# Patient Record
Sex: Male | Born: 1995 | Hispanic: No | Marital: Single | Smoking: Never smoker
Health system: Southern US, Community
[De-identification: ages and names within clinical notes are randomized; demographics above are authoritative.]

## PROBLEM LIST (undated history)

## (undated) DIAGNOSIS — Z789 Other specified health status: Secondary | ICD-10-CM

## (undated) HISTORY — PX: NO PAST SURGERIES: SHX2092

## (undated) SURGERY — APPENDECTOMY, LAPAROSCOPIC
Anesthesia: General

---
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
## 2016-01-01 ENCOUNTER — Observation Stay (HOSPITAL_COMMUNITY): Payer: Medicaid Other | Admitting: Certified Registered"

## 2016-01-01 ENCOUNTER — Encounter (HOSPITAL_COMMUNITY): Admission: EM | Disposition: A | Discharge status: Home / Self Care | Payer: Self-pay | Source: Home / Self Care

## 2016-01-01 ENCOUNTER — Encounter (HOSPITAL_COMMUNITY): Payer: Self-pay

## 2016-01-01 DIAGNOSIS — K3580 Unspecified acute appendicitis: Secondary | ICD-10-CM | POA: Diagnosis present

## 2016-01-01 HISTORY — PX: LAPAROSCOPIC APPENDECTOMY: SHX408

## 2016-01-01 SURGERY — APPENDECTOMY, LAPAROSCOPIC
Anesthesia: General | Site: Abdomen

## 2016-01-01 MED ORDER — MIDAZOLAM HCL 5 MG/5ML IJ SOLN
INTRAMUSCULAR | Status: DC | PRN
  Administered 2016-01-01: 2 mg via INTRAVENOUS

## 2016-01-01 MED ORDER — PROPOFOL 10 MG/ML IV BOLUS
INTRAVENOUS | Status: DC | PRN
  Administered 2016-01-01: 110 mg via INTRAVENOUS

## 2016-01-01 MED ORDER — SUGAMMADEX SODIUM 200 MG/2ML IV SOLN
INTRAVENOUS | Status: AC
  Filled 2016-01-01: qty 2

## 2016-01-01 MED ORDER — ONDANSETRON HCL 4 MG/2ML IJ SOLN: 4.0000 mg | Freq: Four times a day (QID) | INTRAMUSCULAR | Status: DC | PRN

## 2016-01-01 MED ORDER — PHENYLEPHRINE HCL 10 MG/ML IJ SOLN
INTRAMUSCULAR | Status: DC | PRN
  Administered 2016-01-01: 40 ug via INTRAVENOUS

## 2016-01-01 MED ORDER — METRONIDAZOLE IN NACL 5-0.79 MG/ML-% IV SOLN
500.0000 mg | Freq: Once | INTRAVENOUS | Status: AC
  Administered 2016-01-01 (×2): 500 mg via INTRAVENOUS
  Filled 2016-01-01: qty 100

## 2016-01-01 MED ORDER — KCL IN DEXTROSE-NACL 20-5-0.45 MEQ/L-%-% IV SOLN: INTRAVENOUS | Status: DC

## 2016-01-01 MED ORDER — PROPOFOL 10 MG/ML IV BOLUS
INTRAVENOUS | Status: AC
  Filled 2016-01-01: qty 20

## 2016-01-01 MED ORDER — MEPERIDINE HCL 25 MG/ML IJ SOLN: 6.2500 mg | INTRAMUSCULAR | Status: DC | PRN

## 2016-01-01 MED ORDER — ONDANSETRON HCL 4 MG/2ML IJ SOLN
INTRAMUSCULAR | Status: DC | PRN
  Administered 2016-01-01: 4 mg via INTRAVENOUS

## 2016-01-01 MED ORDER — SUCCINYLCHOLINE CHLORIDE 20 MG/ML IJ SOLN
INTRAMUSCULAR | Status: DC | PRN
  Administered 2016-01-01: 120 mg via INTRAVENOUS

## 2016-01-01 MED ORDER — MIDAZOLAM HCL 2 MG/2ML IJ SOLN
INTRAMUSCULAR | Status: AC
  Filled 2016-01-01: qty 2

## 2016-01-01 MED ORDER — ONDANSETRON HCL 4 MG/2ML IJ SOLN: 4.0000 mg | Freq: Three times a day (TID) | INTRAMUSCULAR | Status: DC | PRN

## 2016-01-01 MED ORDER — ONDANSETRON HCL 4 MG/2ML IJ SOLN: 4.0000 mg | Freq: Once | INTRAMUSCULAR | Status: DC | PRN

## 2016-01-01 MED ORDER — LIDOCAINE HCL (CARDIAC) 20 MG/ML IV SOLN
INTRAVENOUS | Status: DC | PRN
  Administered 2016-01-01: 100 mg via INTRAVENOUS

## 2016-01-01 MED ORDER — FENTANYL CITRATE (PF) 100 MCG/2ML IJ SOLN
INTRAMUSCULAR | Status: AC
  Filled 2016-01-01: qty 4

## 2016-01-01 MED ORDER — ROCURONIUM BROMIDE 100 MG/10ML IV SOLN
INTRAVENOUS | Status: DC | PRN
  Administered 2016-01-01: 50 mg via INTRAVENOUS

## 2016-01-01 MED ORDER — ONDANSETRON HCL 4 MG/2ML IJ SOLN
INTRAMUSCULAR | Status: AC
  Filled 2016-01-01: qty 2

## 2016-01-01 MED ORDER — HYDROMORPHONE HCL 1 MG/ML IJ SOLN: 0.2500 mg | INTRAMUSCULAR | Status: DC | PRN

## 2016-01-01 MED ORDER — BUPIVACAINE-EPINEPHRINE 0.25% -1:200000 IJ SOLN
INTRAMUSCULAR | Status: DC | PRN
Start: 2016-01-01 — End: 2016-01-01
  Administered 2016-01-01: 15 mL

## 2016-01-01 MED ORDER — SUGAMMADEX SODIUM 200 MG/2ML IV SOLN
INTRAVENOUS | Status: DC | PRN
  Administered 2016-01-01: 200 mg via INTRAVENOUS

## 2016-01-01 MED ORDER — BUPIVACAINE-EPINEPHRINE 0.25% -1:200000 IJ SOLN
INTRAMUSCULAR | Status: AC
  Filled 2016-01-01: qty 1

## 2016-01-01 MED ORDER — HYDROMORPHONE HCL 1 MG/ML IJ SOLN: 0.5000 mg | INTRAMUSCULAR | Status: DC | PRN

## 2016-01-01 MED ORDER — OXYCODONE-ACETAMINOPHEN 5-325 MG PO TABS
1.0000 | ORAL_TABLET | ORAL | Status: DC | PRN
  Administered 2016-01-01 (×2): 2 via ORAL
  Filled 2016-01-01 (×2): qty 2

## 2016-01-01 MED ORDER — FENTANYL CITRATE (PF) 100 MCG/2ML IJ SOLN
INTRAMUSCULAR | Status: DC | PRN
  Administered 2016-01-01: 50 ug via INTRAVENOUS
  Administered 2016-01-01: 150 ug via INTRAVENOUS

## 2016-01-01 MED ORDER — OXYCODONE-ACETAMINOPHEN 5-325 MG PO TABS: 1.0000 | ORAL_TABLET | ORAL | 0 refills | Status: AC | PRN

## 2016-01-01 MED ORDER — ENOXAPARIN SODIUM 40 MG/0.4ML ~~LOC~~ SOLN
40.0000 mg | SUBCUTANEOUS | Status: DC
  Administered 2016-01-01: 40 mg via SUBCUTANEOUS
  Filled 2016-01-01: qty 0.4

## 2016-01-01 MED ORDER — ALBUTEROL SULFATE (2.5 MG/3ML) 0.083% IN NEBU: 2.5000 mg | INHALATION_SOLUTION | Freq: Four times a day (QID) | RESPIRATORY_TRACT | Status: DC | PRN

## 2016-01-01 MED ORDER — SODIUM CHLORIDE 0.9 % IR SOLN
Status: DC | PRN
Start: 2016-01-01 — End: 2016-01-01
  Administered 2016-01-01: 1000 mL

## 2016-01-01 MED ORDER — ONDANSETRON 4 MG PO TBDP: 4.0000 mg | ORAL_TABLET | Freq: Four times a day (QID) | ORAL | Status: DC | PRN

## 2016-01-01 MED ORDER — LACTATED RINGERS IV SOLN
INTRAVENOUS | Status: DC | PRN
  Administered 2016-01-01 (×2): via INTRAVENOUS

## 2016-01-01 MED ORDER — DEXTROSE 5 % IV SOLN
2.0000 g | Freq: Once | INTRAVENOUS | Status: AC
  Administered 2016-01-01: 2 g via INTRAVENOUS
  Filled 2016-01-01: qty 2

## 2016-01-01 MED ORDER — METRONIDAZOLE IN NACL 5-0.79 MG/ML-% IV SOLN
500.0000 mg | Freq: Three times a day (TID) | INTRAVENOUS | Status: DC
  Administered 2016-01-01 – 2016-01-02 (×4): 500 mg via INTRAVENOUS
  Filled 2016-01-01 (×5): qty 100

## 2016-01-01 MED ORDER — 0.9 % SODIUM CHLORIDE (POUR BTL) OPTIME
TOPICAL | Status: DC | PRN
  Administered 2016-01-01: 1000 mL

## 2016-01-01 MED ORDER — KCL IN DEXTROSE-NACL 20-5-0.45 MEQ/L-%-% IV SOLN
INTRAVENOUS | Status: DC
  Administered 2016-01-01: 05:00:00 via INTRAVENOUS
  Filled 2016-01-01 (×2): qty 1000

## 2016-01-01 SURGICAL SUPPLY — 50 items
ADH SKN CLS APL DERMABOND .7 (GAUZE/BANDAGES/DRESSINGS) ×1
ADH SKN CLS LQ APL DERMABOND (GAUZE/BANDAGES/DRESSINGS) ×1
APPLIER CLIP ROT 10 11.4 M/L (STAPLE)
APR CLP MED LRG 11.4X10 (STAPLE)
BAG SPEC RTRVL LRG 6X4 10 (ENDOMECHANICALS) ×1
BLADE SURG ROTATE 9660 (MISCELLANEOUS) IMPLANT
CANISTER SUCTION 2500CC (MISCELLANEOUS) ×3 IMPLANT
CHLORAPREP W/TINT 26ML (MISCELLANEOUS) ×3 IMPLANT
CLIP APPLIE ROT 10 11.4 M/L (STAPLE) IMPLANT
CLOSURE STERI-STRIP 1/2X4 (GAUZE/BANDAGES/DRESSINGS) ×1
CLOSURE WOUND 1/2 X4 (GAUZE/BANDAGES/DRESSINGS) ×1
CLSR STERI-STRIP ANTIMIC 1/2X4 (GAUZE/BANDAGES/DRESSINGS) ×1 IMPLANT
COVER SURGICAL LIGHT HANDLE (MISCELLANEOUS) ×3 IMPLANT
CUTTER FLEX LINEAR 45M (STAPLE) ×3 IMPLANT
DERMABOND ADHESIVE PROPEN (GAUZE/BANDAGES/DRESSINGS) ×2
DERMABOND ADVANCED (GAUZE/BANDAGES/DRESSINGS) ×2
DERMABOND ADVANCED .7 DNX12 (GAUZE/BANDAGES/DRESSINGS) ×1 IMPLANT
DERMABOND ADVANCED .7 DNX6 (GAUZE/BANDAGES/DRESSINGS) IMPLANT
DRSG TEGADERM 2-3/8X2-3/4 SM (GAUZE/BANDAGES/DRESSINGS) ×5 IMPLANT
ELECT REM PT RETURN 9FT ADLT (ELECTROSURGICAL) ×3
ELECTRODE REM PT RTRN 9FT ADLT (ELECTROSURGICAL) ×1 IMPLANT
ENDOLOOP SUT PDS II  0 18 (SUTURE)
ENDOLOOP SUT PDS II 0 18 (SUTURE) IMPLANT
GLOVE BIOGEL PI IND STRL 8 (GLOVE) ×1 IMPLANT
GLOVE BIOGEL PI INDICATOR 8 (GLOVE) ×2
GLOVE ECLIPSE 7.5 STRL STRAW (GLOVE) ×3 IMPLANT
GOWN STRL REUS W/ TWL LRG LVL3 (GOWN DISPOSABLE) ×3 IMPLANT
GOWN STRL REUS W/TWL LRG LVL3 (GOWN DISPOSABLE) ×9
KIT BASIN OR (CUSTOM PROCEDURE TRAY) ×3 IMPLANT
KIT ROOM TURNOVER OR (KITS) ×3 IMPLANT
NS IRRIG 1000ML POUR BTL (IV SOLUTION) ×3 IMPLANT
PAD ARMBOARD 7.5X6 YLW CONV (MISCELLANEOUS) ×6 IMPLANT
POUCH SPECIMEN RETRIEVAL 10MM (ENDOMECHANICALS) ×3 IMPLANT
RELOAD 45 VASCULAR/THIN (ENDOMECHANICALS) IMPLANT
RELOAD STAPLE 45 2.5 WHT GRN (ENDOMECHANICALS) IMPLANT
RELOAD STAPLE 45 3.5 BLU ETS (ENDOMECHANICALS) IMPLANT
RELOAD STAPLE TA45 3.5 REG BLU (ENDOMECHANICALS) IMPLANT
SCALPEL HARMONIC ACE (MISCELLANEOUS) ×3 IMPLANT
SET IRRIG TUBING LAPAROSCOPIC (IRRIGATION / IRRIGATOR) ×3 IMPLANT
SLEEVE ENDOPATH XCEL 5M (ENDOMECHANICALS) ×3 IMPLANT
SPECIMEN JAR SMALL (MISCELLANEOUS) ×3 IMPLANT
STRIP CLOSURE SKIN 1/2X4 (GAUZE/BANDAGES/DRESSINGS) ×2 IMPLANT
SUT MNCRL AB 4-0 PS2 18 (SUTURE) ×3 IMPLANT
TOWEL OR 17X24 6PK STRL BLUE (TOWEL DISPOSABLE) ×3 IMPLANT
TOWEL OR 17X26 10 PK STRL BLUE (TOWEL DISPOSABLE) ×3 IMPLANT
TRAY FOLEY CATH 16FR SILVER (SET/KITS/TRAYS/PACK) ×3 IMPLANT
TRAY LAPAROSCOPIC MC (CUSTOM PROCEDURE TRAY) ×3 IMPLANT
TROCAR XCEL BLUNT TIP 100MML (ENDOMECHANICALS) ×3 IMPLANT
TROCAR XCEL NON-BLD 5MMX100MML (ENDOMECHANICALS) ×3 IMPLANT
TUBING INSUFFLATION (TUBING) ×3 IMPLANT

## 2016-01-01 NOTE — Anesthesia Preprocedure Evaluation (Addendum)
Anesthesia Evaluation  Patient identified by MRN, date of birth, ID band Patient awake    Reviewed: Allergy & Precautions  Airway Mallampati: I  TM Distance: >3 FB Neck ROM: Full    Dental  (+) Teeth Intact, Dental Advisory Given   Pulmonary           Cardiovascular      Neuro/Psych    GI/Hepatic   Endo/Other    Renal/GU      Musculoskeletal   Abdominal   Peds  Hematology   Anesthesia Other Findings   Reproductive/Obstetrics                            Anesthesia Physical Anesthesia Plan  ASA: I and emergent  Anesthesia Plan: General   Post-op Pain Management:    Induction: Intravenous, Rapid sequence and Cricoid pressure planned  Airway Management Planned: Oral ETT  Additional Equipment:   Intra-op Plan:   Post-operative Plan: Extubation in OR  Informed Consent: I have reviewed the patients History and Physical, chart, labs and discussed the procedure including the risks, benefits and alternatives for the proposed anesthesia with the patient or authorized representative who has indicated his/her understanding and acceptance.   Dental advisory given  Plan Discussed with: Anesthesiologist and Surgeon  Anesthesia Plan Comments:         Anesthesia Quick Evaluation

## 2016-01-01 NOTE — Anesthesia Postprocedure Evaluation (Signed)
Anesthesia Post Note  Patient: Casey Burkittmilio M Shavers  Procedure(s) Performed: Procedure(s) (LRB): APPENDECTOMY LAPAROSCOPIC (N/A)  Patient location during evaluation: PACU Anesthesia Type: General Level of consciousness: awake and alert Pain management: pain level controlled Vital Signs Assessment: post-procedure vital signs reviewed and stable Respiratory status: spontaneous breathing, nonlabored ventilation, respiratory function stable and patient connected to nasal cannula oxygen Cardiovascular status: blood pressure returned to baseline and stable Postop Assessment: no signs of nausea or vomiting Anesthetic complications: no    Last Vitals:  Vitals:   01/01/16 0625 01/01/16 1010  BP: (!) 105/53 122/63  Pulse: (!) 102 90  Resp: 18   Temp: 36.9 C 37.3 C    Last Pain:  Vitals:   01/01/16 1100  TempSrc:   PainSc: Asleep                 Kaicen Desena DAVID

## 2016-01-01 NOTE — ED Notes (Signed)
Dr. Lindie SpruceWyatt in to assess pt. For surgery.

## 2016-01-01 NOTE — H&P (Signed)
Blake Castillo is an 20 y.o. male.   Chief Complaint: RLQ abdominal pain HPI: Sick since this AM with severe pain in the RLQ associated with nausea and vomiting.  No fevers, but had chills.  CT shows enlarged, distended appendix  History reviewed. No pertinent past medical history.  History reviewed. No pertinent surgical history.  No family history on file. Social History:  reports that he has never smoked. He does not have any smokeless tobacco history on file. He reports that he does not drink alcohol. His drug history is not on file.  Allergies: No Known Allergies   (Not in a hospital admission)  Results for orders placed or performed during the hospital encounter of 12/31/15 (from the past 48 hour(s))  Urinalysis, Routine w reflex microscopic     Status: Abnormal   Collection Time: 12/31/15  5:52 PM  Result Value Ref Range   Color, Urine YELLOW YELLOW   APPearance CLEAR CLEAR   Specific Gravity, Urine 1.025 1.005 - 1.030   pH 6.0 5.0 - 8.0   Glucose, UA NEGATIVE NEGATIVE mg/dL   Hgb urine dipstick SMALL (A) NEGATIVE   Bilirubin Urine NEGATIVE NEGATIVE   Ketones, ur 40 (A) NEGATIVE mg/dL   Protein, ur NEGATIVE NEGATIVE mg/dL   Nitrite NEGATIVE NEGATIVE   Leukocytes, UA NEGATIVE NEGATIVE  Urine microscopic-add on     Status: Abnormal   Collection Time: 12/31/15  5:52 PM  Result Value Ref Range   Squamous Epithelial / LPF 0-5 (A) NONE SEEN   WBC, UA 0-5 0 - 5 WBC/hpf   RBC / HPF 0-5 0 - 5 RBC/hpf   Bacteria, UA RARE (A) NONE SEEN  Lipase, blood     Status: None   Collection Time: 12/31/15  5:57 PM  Result Value Ref Range   Lipase 21 11 - 51 U/L  Comprehensive metabolic panel     Status: Abnormal   Collection Time: 12/31/15  5:57 PM  Result Value Ref Range   Sodium 135 135 - 145 mmol/L   Potassium 3.5 3.5 - 5.1 mmol/L   Chloride 98 (L) 101 - 111 mmol/L   CO2 28 22 - 32 mmol/L   Glucose, Bld 112 (H) 65 - 99 mg/dL   BUN 9 6 - 20 mg/dL   Creatinine, Ser 0.95 0.61 -  1.24 mg/dL   Calcium 9.6 8.9 - 10.3 mg/dL   Total Protein 7.7 6.5 - 8.1 g/dL   Albumin 4.2 3.5 - 5.0 g/dL   AST 20 15 - 41 U/L   ALT 15 (L) 17 - 63 U/L   Alkaline Phosphatase 120 38 - 126 U/L   Total Bilirubin 1.0 0.3 - 1.2 mg/dL   GFR calc non Af Amer >60 >60 mL/min   GFR calc Af Amer >60 >60 mL/min    Comment: (NOTE) The eGFR has been calculated using the CKD EPI equation. This calculation has not been validated in all clinical situations. eGFR's persistently <60 mL/min signify possible Chronic Kidney Disease.    Anion gap 9 5 - 15  CBC     Status: Abnormal   Collection Time: 12/31/15  5:57 PM  Result Value Ref Range   WBC 20.3 (H) 4.0 - 10.5 K/uL   RBC 5.23 4.22 - 5.81 MIL/uL   Hemoglobin 15.2 13.0 - 17.0 g/dL   HCT 45.7 39.0 - 52.0 %   MCV 87.4 78.0 - 100.0 fL   MCH 29.1 26.0 - 34.0 pg   MCHC 33.3 30.0 - 36.0  g/dL   RDW 12.2 11.5 - 15.5 %   Platelets 243 150 - 400 K/uL   Ct Abdomen Pelvis W Contrast  Result Date: 01/01/2016 CLINICAL DATA:  Nausea vomiting and stomach pain. EXAM: CT ABDOMEN AND PELVIS WITH CONTRAST TECHNIQUE: Multidetector CT imaging of the abdomen and pelvis was performed using the standard protocol following bolus administration of intravenous contrast. CONTRAST:  1 ISOVUE-300 IOPAMIDOL (ISOVUE-300) INJECTION 61% COMPARISON:  None. FINDINGS: Lower chest: No acute abnormality. Hepatobiliary: No focal liver abnormality is seen. No gallstones, gallbladder wall thickening, or biliary dilatation. Pancreas: Unremarkable. No pancreatic ductal dilatation or surrounding inflammatory changes. Spleen: Normal in size without focal abnormality. Adrenals/Urinary Tract: Adrenal glands are unremarkable. Kidneys are normal, without renal calculi, focal lesion, or hydronephrosis. Bladder is unremarkable. Stomach/Bowel: Stomach is within normal limits. No evidence of small-bowel obstruction. The retrocecal appendix as distended to 16 mm and fluid-filled. Several appendicoliths are  seen at the appendiceal base. Periappendiceal mesenteric stranding is seen. No evidence of rupture or abscess formation. Vascular/Lymphatic: No significant vascular findings are present. No enlarged abdominal or pelvic lymph nodes. Reproductive: Prostate is unremarkable. Other: No abdominal wall hernia or abnormality. No abdominopelvic ascites. Musculoskeletal: No acute or significant osseous findings. IMPRESSION: Retrocecal appendix with acute appendicitis. There is no evidence of rupture or abscess formation, however the appendix is significantly distended to 18 mm, making impending rupture likely. Electronically Signed   By: Fidela Salisbury M.D.   On: 01/01/2016 00:09    Review of Systems  Constitutional: Positive for chills. Negative for fever.  Gastrointestinal: Positive for abdominal pain, nausea and vomiting.  All other systems reviewed and are negative.   Blood pressure 111/59, pulse 95, temperature 98.7 F (37.1 C), temperature source Oral, resp. rate 14, SpO2 98 %. Physical Exam  Constitutional: He is oriented to person, place, and time. He appears well-developed and well-nourished.  HENT:  Head: Normocephalic and atraumatic.  Eyes: Conjunctivae and EOM are normal. Pupils are equal, round, and reactive to light.  Neck: Normal range of motion. Neck supple.  Cardiovascular: Normal rate, regular rhythm, normal heart sounds and intact distal pulses.   Respiratory: Effort normal.  GI: Soft. Bowel sounds are normal. He exhibits no distension. There is tenderness in the right lower quadrant. There is tenderness at McBurney's point. There is no rigidity, no rebound and no guarding.  Musculoskeletal: Normal range of motion.  Neurological: He is alert and oriented to person, place, and time. He has normal reflexes.  Skin: Skin is warm and dry.  Psychiatric: He has a normal mood and affect. His behavior is normal. Judgment and thought content normal.     Assessment/Plan Acute  appendicitis Admit for IV antibiotics and subsequently laparoscopic appendectomy.  The appendix is markedly distended and rupture seems imminent.  Will call in call team and perform lap appy ASAP.  Judeth Horn, MD 01/01/2016, 12:23 AM

## 2016-01-01 NOTE — Op Note (Signed)
OPERATIVE REPORT  DATE OF OPERATION:  01/01/2016  PATIENT:  Blake Castillo  20 y.o. male  PRE-OPERATIVE DIAGNOSIS:  Acute appendicitis  POST-OPERATIVE DIAGNOSIS:  Acute appendicitis  INDICATION(S) FOR OPERATION:  Abdominal pain from acute appendicitis  FINDINGS:  Markedly dilated and suppurative appendix  PROCEDURE:  Procedure(s): APPENDECTOMY LAPAROSCOPIC  SURGEON:  Surgeon(s): Jimmye NormanJames Chetara Kropp, MD  ASSISTANT: NOne  ANESTHESIA:   general  COMPLICATIONS:  None  EBL: <20 ml  BLOOD ADMINISTERED: none  DRAINS: none   SPECIMEN:  Source of Specimen:  Appendix  COUNTS CORRECT:  YES  PROCEDURE DETAILS: The patient was taken to the operating room and placed on the table in the supine position. After an adequate general endotracheal anesthetic was administered he was prepped and draped in the usual sterile manner exposing his abdomen.  A proper timeout was performed identifying the patient and procedure to be performed.  A supraumbilical midline incision was made using a #15 blade and taken down to the midline fascia. We incised the fascia using the 15 blade then subsequently bluntly dissected down into the peritoneal cavity. A pursestring suture was passed around the fascial opening which secured in place a Hassan cannula which insufflated carbon dioxide gas up to a maximal abdominal pressure of 15 mmHg.  25 mm cannula was subsequently passed into the right upper quadrant and the left 4 quadrant under direct vision. Once all cannulas were in place the patient was placed in Trendelenburg and the left side was tilted down and the dissection begun.  The patient had a markedly inflamed in distended appendix which was in the right lateral posterior aspect. It was dissected free of the posterior lateral wall and then the mesoappendix was taken down using a Harmonic Scalpel. Once the base of the appendix at the cecum had been adequately exposed we came across the base of the appendix at the  cecum using a blue cartridge Endo GIA.  Once the appendix was completely detached we retreated from the perineal cavity using Endo Catch bag. The wound inspected the area of the appendectomy site found there to be minimal to no bleeding. We aspirated all fluid and gas removed all cannulas.  The incision at the supraumbilical site of the fascia was closed using the pursestring suture and site. The skin was subsequently closed using running subcuticular stitch of 4-0 Monocryl. 0.25% Marcaine was injected at all sites. Dermabond, Steri-Strips, and Tegaderms used to complete the dressings. All needle counts, sponge counts, and instrument counts were correct.  PATIENT DISPOSITION:  PACU - hemodynamically stable.   Kirtan Sada 10/9/20172:48 AM

## 2016-01-01 NOTE — Anesthesia Preprocedure Evaluation (Signed)
Anesthesia Evaluation  Patient identified by MRN, date of birth, ID band Patient awake    Reviewed: Allergy & Precautions, NPO status , Patient's Chart, lab work & pertinent test results  Airway Mallampati: I  TM Distance: >3 FB Neck ROM: Full    Dental   Pulmonary    Pulmonary exam normal       Cardiovascular Normal cardiovascular exam    Neuro/Psych    GI/Hepatic   Endo/Other    Renal/GU      Musculoskeletal   Abdominal   Peds  Hematology   Anesthesia Other Findings   Reproductive/Obstetrics                             Anesthesia Physical Anesthesia Plan  ASA: II and emergent  Anesthesia Plan: General   Post-op Pain Management:    Induction: Intravenous, Rapid sequence and Cricoid pressure planned  Airway Management Planned: Oral ETT  Additional Equipment:   Intra-op Plan:   Post-operative Plan: Extubation in OR  Informed Consent: I have reviewed the patients History and Physical, chart, labs and discussed the procedure including the risks, benefits and alternatives for the proposed anesthesia with the patient or authorized representative who has indicated his/her understanding and acceptance.     Plan Discussed with: CRNA and Surgeon  Anesthesia Plan Comments:         Anesthesia Quick Evaluation  

## 2016-01-01 NOTE — Progress Notes (Signed)
Patient ID: Blake Castillo, male   DOB: 09/20/1995, 20 y.o.   MRN: [id number]  [facility]  Day of Surgery  Subjective: Feeling well this morning, pain significantly improved from prior to surgery. Pain well controlled. Tolerating small amounts of food. Denies n/v. No flatus. Walked small amounts to/from bathroom  Objective: Vital signs in last 24 hours: Temp:  [97.5 F (36.4 C)-99.1 F (37.3 C)] 99.1 F (37.3 C) (10/09 1010) Pulse Rate:  [90-120] 90 (10/09 1010) Resp:  [14-29] 18 (10/09 0625) BP: (105-135)/(40-80) 122/63 (10/09 1010) SpO2:  [96 %-100 %] 99 % (10/09 1010) Weight:  [188 lb (85.3 kg)] 188 lb (85.3 kg) (10/09 0321) Last BM Date: 12/31/15  Intake/Output from previous day: 10/08 0701 - 10/09 0700 In: 2200 [I.V.:1200; IV Piggyback:1000] Out: 350 [Urine:300; Blood:50] Intake/Output this shift: No intake/output data recorded.  PE: Gen:  Alert, NAD, pleasant Card:  RRR, no M/G/R heard Pulm:  CTAB Abd: Soft, appropriately tender, ND, +BS, incisions cdi covered with steri strips and blood but no active drainage  Lab Results:   Recent Labs  12/31/15 1757  WBC 20.3*  HGB 15.2  HCT 45.7  PLT 243   BMET  Recent Labs  12/31/15 1757  NA 135  K 3.5  CL 98*  CO2 28  GLUCOSE 112*  BUN 9  CREATININE 0.95  CALCIUM 9.6   PT/INR No results for input(s): LABPROT, INR in the last 72 hours. CMP     Component Value Date/Time   NA 135 12/31/2015 1757   K 3.5 12/31/2015 1757   CL 98 (L) 12/31/2015 1757   CO2 28 12/31/2015 1757   GLUCOSE 112 (H) 12/31/2015 1757   BUN 9 12/31/2015 1757   CREATININE 0.95 12/31/2015 1757   CALCIUM 9.6 12/31/2015 1757   PROT 7.7 12/31/2015 1757   ALBUMIN 4.2 12/31/2015 1757   AST 20 12/31/2015 1757   ALT 15 (L) 12/31/2015 1757   ALKPHOS 120 12/31/2015 1757   BILITOT 1.0 12/31/2015 1757   GFRNONAA >60 12/31/2015 1757   GFRAA >60 12/31/2015 1757   Lipase     Component Value Date/Time   LIPASE  21 12/31/2015 1757       Studies/Results: Ct Abdomen Pelvis W Contrast  Result Date: 01/01/2016 CLINICAL DATA:  Nausea vomiting and stomach pain. EXAM: CT ABDOMEN AND PELVIS WITH CONTRAST TECHNIQUE: Multidetector CT imaging of the abdomen and pelvis was performed using the standard protocol following bolus administration of intravenous contrast. CONTRAST:  1 ISOVUE-300 IOPAMIDOL (ISOVUE-300) INJECTION 61% COMPARISON:  None. FINDINGS: Lower chest: No acute abnormality. Hepatobiliary: No focal liver abnormality is seen. No gallstones, gallbladder wall thickening, or biliary dilatation. Pancreas: Unremarkable. No pancreatic ductal dilatation or surrounding inflammatory changes. Spleen: Normal in size without focal abnormality. Adrenals/Urinary Tract: Adrenal glands are unremarkable. Kidneys are normal, without renal calculi, focal lesion, or hydronephrosis. Bladder is unremarkable. Stomach/Bowel: Stomach is within normal limits. No evidence of small-bowel obstruction. The retrocecal appendix as distended to 16 mm and fluid-filled. Several appendicoliths are seen at the appendiceal base. Periappendiceal mesenteric stranding is seen. No evidence of rupture or abscess formation. Vascular/Lymphatic: No significant vascular findings are present. No enlarged abdominal or pelvic lymph nodes. Reproductive: Prostate is unremarkable. Other: No abdominal wall hernia or abnormality. No abdominopelvic ascites. Musculoskeletal: No acute or significant osseous findings. IMPRESSION: Retrocecal appendix with acute appendicitis. There is no evidence of rupture or abscess formation, however the appendix is significantly distended to 18 mm, making impending rupture likely. Electronically  Signed   By: Ted Mcalpine M.D.   On: 01/01/2016 00:09    Anti-infectives: Anti-infectives    Start     Dose/Rate Route Frequency Ordered Stop   01/01/16 0800  metroNIDAZOLE (FLAGYL) IVPB 500 mg     500 mg 100 mL/hr over 60 Minutes  Intravenous Every 8 hours 01/01/16 0018     01/01/16 0015  cefTRIAXone (ROCEPHIN) 2 g in dextrose 5 % 50 mL IVPB     2 g 100 mL/hr over 30 Minutes Intravenous  Once 01/01/16 0012 01/01/16 0130   01/01/16 0015  metroNIDAZOLE (FLAGYL) IVPB 500 mg     500 mg 100 mL/hr over 60 Minutes Intravenous  Once 01/01/16 0012 01/01/16 0122       Assessment/Plan S/p laparoscopic appendectomy 01/01/16 Dr. Lindie Spruce - POD 0 - pain controlled, tolerating small amounts of diet, no flatus  FEN - clears advance as tolerated VTE - SCDs, lovenox  Plan - continue to work on diet and ambulation. May be ready for discharge later today. Will not need antibiotics after discharge. Follow-up with Dr. Lindie Spruce in about 3 weeks.   LOS: 0 days    Edson Snowball , [facility] 01/01/2016, 10:14 AM Pager: [phone] Consults: [phone] Mon-Fri 7:00 am-4:30 pm Sat-Sun 7:00 am-11:30 am

## 2016-01-01 NOTE — Discharge Instructions (Signed)

## 2016-01-01 NOTE — Transfer of Care (Signed)
Immediate Anesthesia Transfer of Care Note  Patient: Blake Castillo  Procedure(s) Performed: Procedure(s): APPENDECTOMY LAPAROSCOPIC (N/A)  Patient Location: PACU  Anesthesia Type:General  Level of Consciousness: awake, alert  and oriented  Airway & Oxygen Therapy: Patient Spontanous Breathing  Post-op Assessment: Report given to RN and Post -op Vital signs reviewed and stable  Post vital signs: Reviewed and stable  Last Vitals:  Vitals:   12/31/15 2245 12/31/15 2347  BP: 117/71 111/59  Pulse: 91 95  Resp:  14  Temp:      Last Pain:  Vitals:   12/31/15 2229  TempSrc:   PainSc: 5          Complications: No apparent anesthesia complications

## 2016-01-01 NOTE — ED Notes (Signed)
Op permit signed by pt after procedure explained.  Pt voices understanding and signed consent

## 2016-01-01 NOTE — Anesthesia Procedure Notes (Signed)
Procedure Name: Intubation Date/Time: 01/01/2016 1:35 AM Performed by: Arlice ColtMANESS, Avanna Sowder B Pre-anesthesia Checklist: Patient identified, Emergency Drugs available, Suction available, Patient being monitored and Timeout performed Patient Re-evaluated:Patient Re-evaluated prior to inductionOxygen Delivery Method: Circle system utilized Preoxygenation: Pre-oxygenation with 100% oxygen Intubation Type: IV induction, Rapid sequence and Cricoid Pressure applied Laryngoscope Size: Mac and 3 Grade View: Grade I Tube type: Oral Tube size: 7.5 mm Number of attempts: 1 Airway Equipment and Method: Stylet Placement Confirmation: ETT inserted through vocal cords under direct vision,  positive ETCO2 and breath sounds checked- equal and bilateral Secured at: 22 cm Tube secured with: Tape Dental Injury: Teeth and Oropharynx as per pre-operative assessment

## 2016-01-02 ENCOUNTER — Encounter (HOSPITAL_COMMUNITY): Payer: Self-pay | Admitting: General Surgery

## 2016-01-02 MED ORDER — POLYETHYLENE GLYCOL 3350 17 G PO PACK: 17.0000 g | PACK | Freq: Two times a day (BID) | ORAL | Status: DC | PRN

## 2016-01-02 MED ORDER — POLYETHYLENE GLYCOL 3350 17 G PO PACK: 17.0000 g | PACK | Freq: Two times a day (BID) | ORAL | 0 refills | Status: AC | PRN

## 2016-01-02 MED ORDER — DOCUSATE SODIUM 100 MG PO CAPS: 100.0000 mg | ORAL_CAPSULE | Freq: Every day | ORAL | 0 refills | Status: AC | PRN

## 2016-01-02 MED ORDER — DOCUSATE SODIUM 100 MG PO CAPS: 100.0000 mg | ORAL_CAPSULE | Freq: Every day | ORAL | Status: DC | PRN

## 2016-01-02 NOTE — Progress Notes (Signed)
Patient discharged to home with instructions and prescription given to patient.

## 2016-01-03 NOTE — Discharge Summary (Signed)
[facility] Discharge Summary   Patient ID: Blake Castillo MRN: [id number] DOB/AGE: 19/09/1995 19 y.o.  Admit date: 12/31/2015 Discharge date: 01/03/2016  Admitting Diagnosis: Acute appendicitis   Discharge Diagnosis Patient Active Problem List   Diagnosis Date Noted  . Acute appendicitis 01/01/2016   Consultants N/A  Imaging: 10/817 CT Abd/Pelv W/ Contrast - The retrocecal appendix as distended to 16 mm and fluid-filled. Several appendicoliths are seen at the appendiceal base. Periappendiceal mesenteric stranding is seen. No evidence of rupture or abscess formation.  Procedures Dr. Jimmye NormanJames Wyatt (01/01/16) - Laparoscopic Appendectomy  Hospital Course:  20 y/o male who [facility] with right lower quadrant pain, nausea, and chills.  Workup significant for Leukocytosis (WBC 20.3) and CT scan (above) significant for acute appendicitis.  Patient was admitted and underwent procedure listed above.  Tolerated procedure well and was transferred to the floor.  Diet was advanced as tolerated.  On POD#2, the patient was voiding well, tolerating diet, ambulating well, pain well controlled, vital signs stable, incisions c/d/i and felt stable for discharge home.  Patient will follow up in our office in 2 weeks and knows to call with questions or concerns.   Physical Exam: General:  Alert, NAD, pleasant, comfortable Abd:  Soft, ND, appropriately tender, incisions C/D/I    Medication List    TAKE these medications   albuterol 108 (90 Base) MCG/ACT inhaler Commonly known as:  PROVENTIL HFA;VENTOLIN HFA Inhale 2 puffs into the lungs every 6 (six) hours as needed for wheezing or shortness of breath.   cetirizine 10 MG tablet Commonly known as:  ZYRTEC Take 10 mg by mouth 2 (two) times daily as needed for allergies.   docusate sodium 100 MG capsule Commonly known as:  COLACE Take 1 capsule (100 mg total) by mouth daily as needed for mild constipation.   fluticasone  50 MCG/ACT nasal spray Commonly known as:  FLONASE Place 1 spray into both nostrils 2 (two) times daily as needed for allergies.   oxyCODONE-acetaminophen 5-325 MG tablet Commonly known as:  PERCOCET/ROXICET Take 1 tablet by mouth every 4 (four) hours as needed for moderate pain.   polyethylene glycol packet Commonly known as:  MIRALAX / GLYCOLAX Take 17 g by mouth every 12 (twelve) hours as needed for mild constipation, moderate constipation or severe constipation.   ranitidine 150 MG tablet Commonly known as:  ZANTAC Take 150 mg by mouth 2 (two) times daily as needed for heartburn.       Follow-up Information    Jimmye NormanJAMES WYATT, MD. Go on 01/23/2016.   Specialty:  General Surgery Why:  9:20 AM for post-operative follow-up. Please arrive 30 minutes early to get checked in and fill out any necessary paperwork. Contact information: [address] [place] [address] [phone]           Signed: Hosie SpangleElizabeth Markeese Boyajian, [facility] 01/03/2016, 4:21 PM Pager: [phone] Consults: [phone] Mon-Fri 7:00 am-4:30 pm Sat-Sun 7:00 am-11:30 am

## 2017-04-12 ENCOUNTER — Other Ambulatory Visit: Payer: Self-pay

## 2017-04-12 ENCOUNTER — Encounter: Payer: Self-pay | Admitting: Family Medicine

## 2017-04-12 ENCOUNTER — Ambulatory Visit: Payer: BLUE CROSS/BLUE SHIELD | Admitting: Family Medicine

## 2017-04-12 VITALS — BP 129/79 | HR 92 | Temp 98.7°F | Resp 16 | Ht 70.0 in | Wt 198.2 lb

## 2017-04-12 DIAGNOSIS — Z7689 Persons encountering health services in other specified circumstances: Secondary | ICD-10-CM

## 2017-04-12 DIAGNOSIS — J069 Acute upper respiratory infection, unspecified: Secondary | ICD-10-CM | POA: Diagnosis not present

## 2017-04-12 DIAGNOSIS — J4520 Mild intermittent asthma, uncomplicated: Secondary | ICD-10-CM | POA: Diagnosis not present

## 2017-04-12 DIAGNOSIS — J3089 Other allergic rhinitis: Secondary | ICD-10-CM | POA: Diagnosis not present

## 2017-04-12 MED ORDER — ALBUTEROL SULFATE HFA 108 (90 BASE) MCG/ACT IN AERS: 2.0000 | INHALATION_SPRAY | Freq: Four times a day (QID) | RESPIRATORY_TRACT | 1 refills | Status: AC | PRN

## 2017-04-12 MED ORDER — CETIRIZINE HCL 10 MG PO TABS: 10.0000 mg | ORAL_TABLET | Freq: Two times a day (BID) | ORAL | 11 refills | Status: AC | PRN

## 2017-04-12 MED ORDER — BENZONATATE 200 MG PO CAPS: 200.0000 mg | ORAL_CAPSULE | Freq: Two times a day (BID) | ORAL | 0 refills | Status: AC | PRN

## 2017-04-12 MED ORDER — FLUTICASONE PROPIONATE 50 MCG/ACT NA SUSP: 1.0000 | Freq: Two times a day (BID) | NASAL | 6 refills | Status: AC | PRN

## 2017-04-12 NOTE — Progress Notes (Signed)
Chief Complaint  Patient presents with  . New Patient (Initial Visit)    establish care.  pt c/o nasal congestion, st x 4 days, fever on 04/11/17 with chills but none since. Occasional nausea.    HPI   Reports that he is here to establish care He is still planning on seeing his normal PCP Dr. 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 Medical history non-contributory     Current Outpatient Medications  Medication Sig Dispense Refill  . albuterol (PROVENTIL HFA;VENTOLIN HFA) 108 (90 Base) MCG/ACT inhaler Inhale 2 puffs into the lungs every 6 (six) hours as needed for wheezing or shortness of breath. 18 g 1  . cetirizine (ZYRTEC) 10 MG tablet Take 1 tablet (10 mg total) by mouth 2 (two) times daily as needed for allergies. 30 tablet 11  . docusate sodium (COLACE) 100 MG capsule Take 1 capsule (100 mg total) by mouth daily as needed for mild constipation. 10 capsule 0  . fluticasone (FLONASE) 50 MCG/ACT nasal spray Place 1 spray into both nostrils 2 (two) times daily as needed for allergies. 16 g 6  . polyethylene glycol (MIRALAX / GLYCOLAX) packet Take 17 g by mouth every 12 (twelve) hours as needed for mild constipation, moderate constipation or severe constipation. 14 each 0  . ranitidine (ZANTAC) 150 MG tablet Take 150 mg by mouth 2 (two) times daily as needed for heartburn.     . benzonatate (TESSALON) 200 MG capsule Take 1 capsule (200 mg total) by mouth 2 (two) times daily as needed for cough. 20 capsule 0  . oxyCODONE-acetaminophen (PERCOCET/ROXICET) 5-325 MG tablet Take 1 tablet by mouth every 4 (four) hours as needed for moderate pain. (Patient not taking: Reported on 04/12/2017) 30 tablet 0   No current  facility-administered medications for this visit.     Allergies: No Known Allergies  Past Surgical History:  Procedure Laterality Date  . LAPAROSCOPIC APPENDECTOMY N/A 01/01/2016   Procedure: APPENDECTOMY LAPAROSCOPIC;  Surgeon: Jimmye NormanJames Wyatt, MD;  [facility];  Service: General;  Laterality: N/A;  . NO PAST SURGERIES      Social History   Socioeconomic History  . Marital status: Single    Spouse name: None  . Number of children: None  . Years of education: None  . Highest education level: None  Social Needs  . Financial resource strain: None  . Food insecurity - worry: None  . Food insecurity - inability: None  . Transportation needs - medical: None  . Transportation needs - non-medical: None  Occupational History  . None  Tobacco Use  . Smoking status: Never Smoker  . Smokeless tobacco: Never Used  Substance and Sexual Activity  . Alcohol use: No  . Drug use: No  . Sexual activity: No  Other Topics Concern  . None  Social History Narrative  . None    Family History  Problem Relation Age of Onset  . Hypertension Mother      ROS Review of Systems See HPI Constitution: + fevers no chills No malaise No diaphoresis Skin: No rash or itching Eyes: no blurry vision, no double vision GU: no dysuria or hematuria Neuro: no dizziness or headaches all others reviewed and negative  Objective: Vitals:   04/12/17 1020  BP: 129/79  Pulse: 92  Resp: 16  Temp: 98.7 F (37.1 C)  TempSrc: Oral  SpO2: 98%  Weight: 198 lb 3.2 oz (89.9 kg)  Height: 5\' 10"  (1.778 m)    Physical Exam General: alert, oriented, in NAD Head: normocephalic, atraumatic, no sinus tenderness Eyes: EOM intact, no scleral icterus or conjunctival injection Ears: TM clear bilaterally Nose: mucosa nonerythematous, nonedematous Throat: no pharyngeal exudate or erythema Lymph: no posterior auricular, submental or cervical lymph adenopathy Heart: normal rate, normal sinus rhythm, no  murmurs Lungs: clear to auscultation bilaterally, no wheezing   Assessment and Plan Garan was seen today for new patient (initial visit).  Diagnoses and all orders for this visit:  Acute URI- discussed allergic rhinitis and treatment Today symptoms suggestive of viral uri  Allergic Rhinitis - refilled antihistamine and flonase  Encounter to establish care with new doctor  Well controlled intermittent asthma- advised albuterol prn Flu shot utd  Other orders -     Cancel: Tdap vaccine greater than or equal to 7yo IM -     Cancel: Flu Vaccine QUAD 36+ mos IM -     benzonatate (TESSALON) 200 MG capsule; Take 1 capsule (200 mg total) by mouth 2 (two) times daily as needed for cough. -     fluticasone (FLONASE) 50 MCG/ACT nasal spray; Place 1 spray into both nostrils 2 (two) times daily as needed for allergies. -     cetirizine (ZYRTEC) 10 MG tablet; Take 1 tablet (10 mg total) by mouth 2 (two) times daily as needed for allergies. -     albuterol (PROVENTIL HFA;VENTOLIN HFA) 108 (90 Base) MCG/ACT inhaler; Inhale 2 puffs into the lungs every 6 (six) hours as needed for wheezing or shortness of breath.     Bradin Mcadory A Custer Pimenta

## 2017-04-12 NOTE — Patient Instructions (Addendum)
   IF you received an x-ray today, you will receive an invoice from [facility]. Please contact [facility] at [phone] with questions or concerns regarding your invoice.   IF you received labwork today, you will receive an invoice from [facility]. Please contact [facility] at [phone] with questions or concerns regarding your invoice.   Our billing staff will not be able to assist you with questions regarding bills from these companies.  You will be contacted with the lab results as soon as they are available. The fastest way to get your results is to activate your My Chart account. Instructions are located on the last page of this paperwork. If you have not heard from us regarding the results in 2 weeks, please contact this office.    Allergic Rhinitis, Adult Allergic rhinitis is an allergic reaction that affects the mucous membrane inside the nose. It causes sneezing, a runny or stuffy nose, and the feeling of mucus going down the back of the throat (postnasal drip). Allergic rhinitis can be mild to severe. There are two types of allergic rhinitis:  Seasonal. This type is also called hay fever. It happens only during certain seasons.  Perennial. This type can happen at any time of the year.  What are the causes? This condition happens when the body's defense system (immune system) responds to certain harmless substances called allergens as though they were germs.  Seasonal allergic rhinitis is triggered by pollen, which can come from grasses, trees, and weeds. Perennial allergic rhinitis may be caused by:  House dust mites.  Pet dander.  Mold spores.  What are the signs or symptoms? Symptoms of this condition include:  Sneezing.  Runny or stuffy nose (nasal congestion).  Postnasal drip.  Itchy nose.  Tearing of the eyes.  Trouble sleeping.  Daytime sleepiness.  How is this diagnosed? This condition may be diagnosed based on:  Your medical  history.  A physical exam.  Tests to check for related conditions, such as: ? Asthma. ? Pink eye. ? Ear infection. ? Upper respiratory infection.  Tests to find out which allergens trigger your symptoms. These may include skin or blood tests.  How is this treated? There is no cure for this condition, but treatment can help control symptoms. Treatment may include:  Taking medicines that block allergy symptoms, such as antihistamines. Medicine may be given as a shot, nasal spray, or pill.  Avoiding the allergen.  Desensitization. This treatment involves getting ongoing shots until your body becomes less sensitive to the allergen. This treatment may be done if other treatments do not help.  If taking medicine and avoiding the allergen does not work, new, stronger medicines may be prescribed.  Follow these instructions at home:  Find out what you are allergic to. Common allergens include smoke, dust, and pollen.  Avoid the things you are allergic to. These are some things you can do to help avoid allergens: ? Replace carpet with wood, tile, or vinyl flooring. Carpet can trap dander and dust. ? Do not smoke. Do not allow smoking in your home. ? Change your heating and air conditioning filter at least once a month. ? During allergy season:  Keep windows closed as much as possible.  Plan outdoor activities when pollen counts are lowest. This is usually during the evening hours.  When coming indoors, change clothing and shower before sitting on furniture or bedding.  Take over-the-counter and prescription medicines only as told by your health care provider.  Keep all   follow-up visits as told by your health care provider. This is important. Contact a health care provider if:  You have a fever.  You develop a persistent cough.  You make whistling sounds when you breathe (you wheeze).  Your symptoms interfere with your normal daily activities. Get help right away if:  You  have shortness of breath. Summary  This condition can be managed by taking medicines as directed and avoiding allergens.  Contact your health care provider if you develop a persistent cough or fever.  During allergy season, keep windows closed as much as possible. This information is not intended to replace advice given to you by your health care provider. Make sure you discuss any questions you have with your health care provider. Document Released: 12/04/2000 Document Revised: 04/18/2016 Document Reviewed: 04/18/2016 Elsevier Interactive Patient Education  2018 [employer]  

## 2017-09-19 IMAGING — CT CT ABD-PELV W/ CM
2 of 4 series · 10 of 46 positions shown, 11 images · IV contrast (Iodine)
Comparison: None.

CLINICAL DATA: Nausea vomiting and stomach pain.

EXAM:
CT ABDOMEN AND PELVIS WITH CONTRAST
TECHNIQUE: Multidetector CT imaging of the abdomen and pelvis was performed
using the standard protocol following bolus administration of
intravenous contrast.
CONTRAST:  1 5PFZYB-BGG IOPAMIDOL (5PFZYB-BGG) INJECTION 61%

[Series 201: routine, idose (2) · axial · 0.78mm/px · z∈[-873,-453]mm · 7 of 102 slices shown, 8 images]
[im 9/102  soft-tissue]
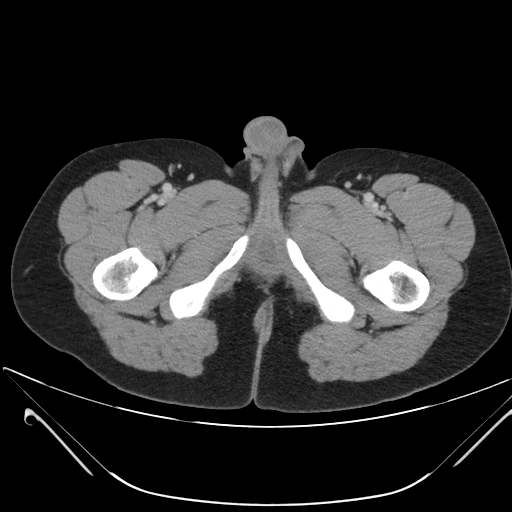
[im 9/102  bone]
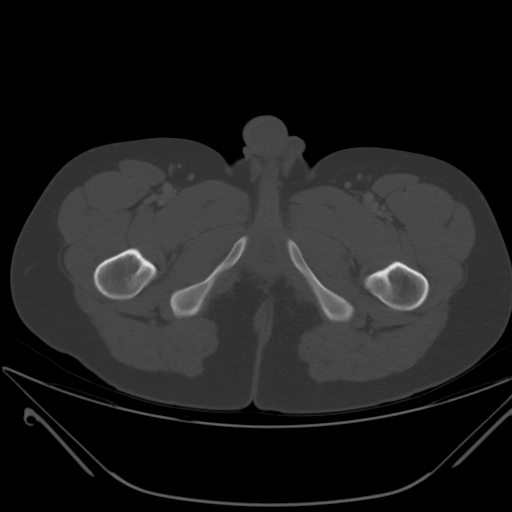
[im 22/102  soft-tissue]
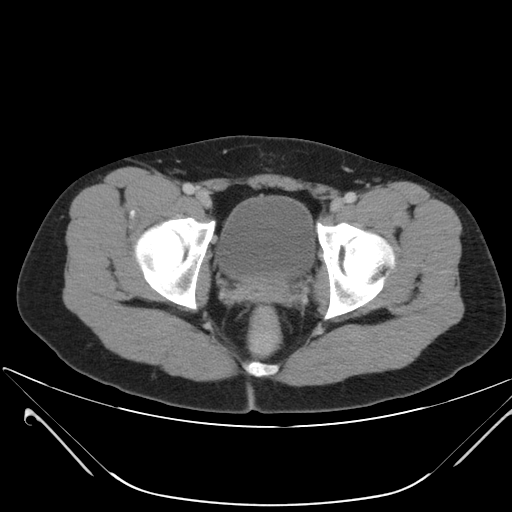
[im 38/102  soft-tissue]
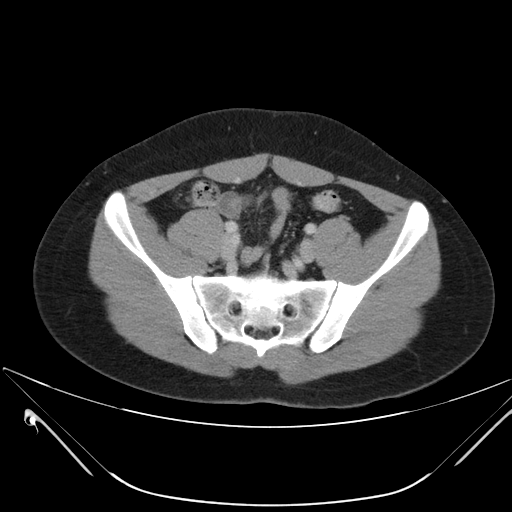
[im 51/102  soft-tissue]
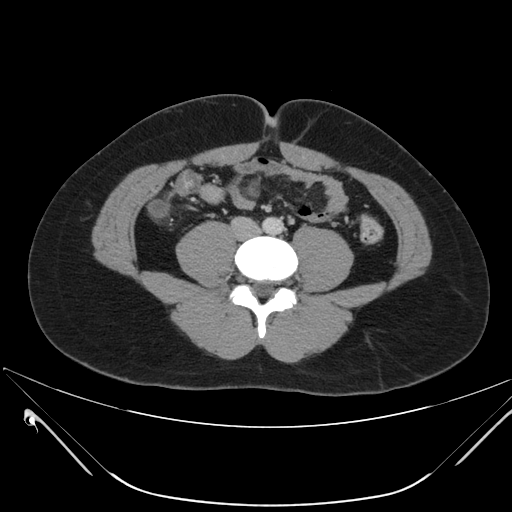
[im 64/102  soft-tissue]
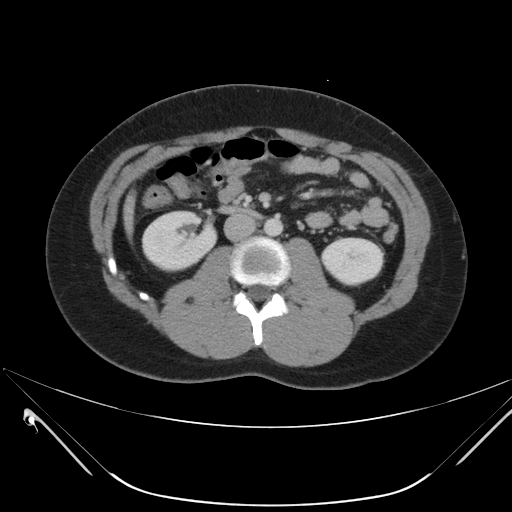
[im 80/102  soft-tissue]
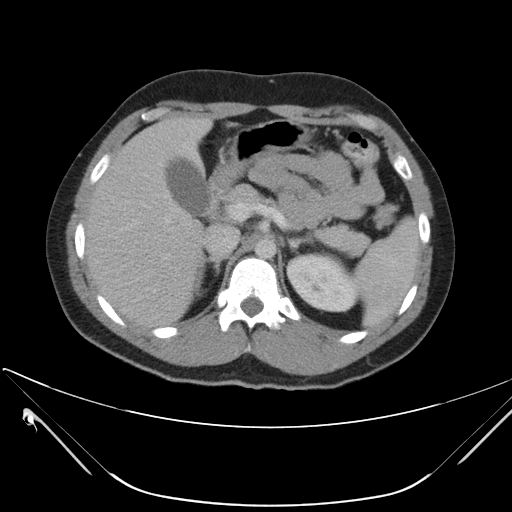
[im 93/102  soft-tissue]
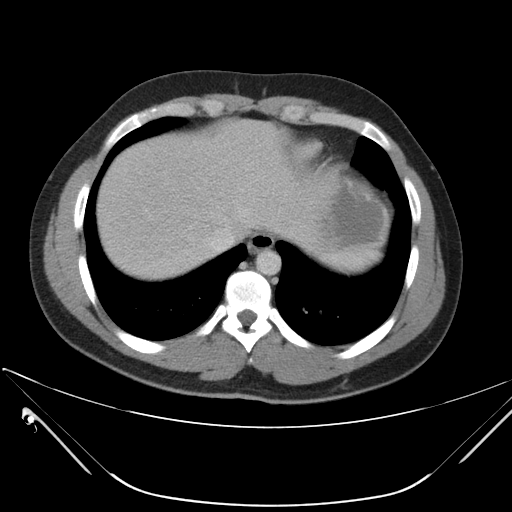

[Series 203: coronals, idose (2) · coronal · 0.45mm/px · 3 of 124 slices shown]
[im 42/124  soft-tissue]
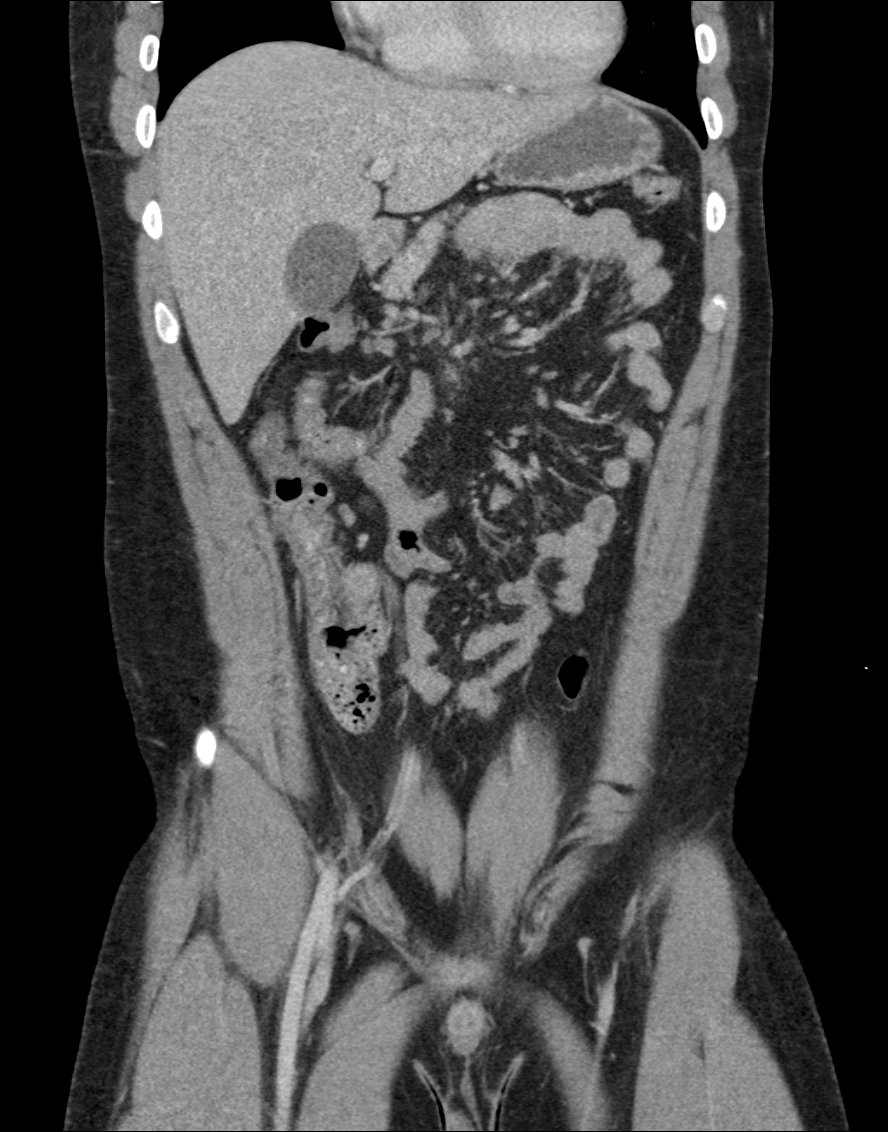
[im 55/124  soft-tissue]
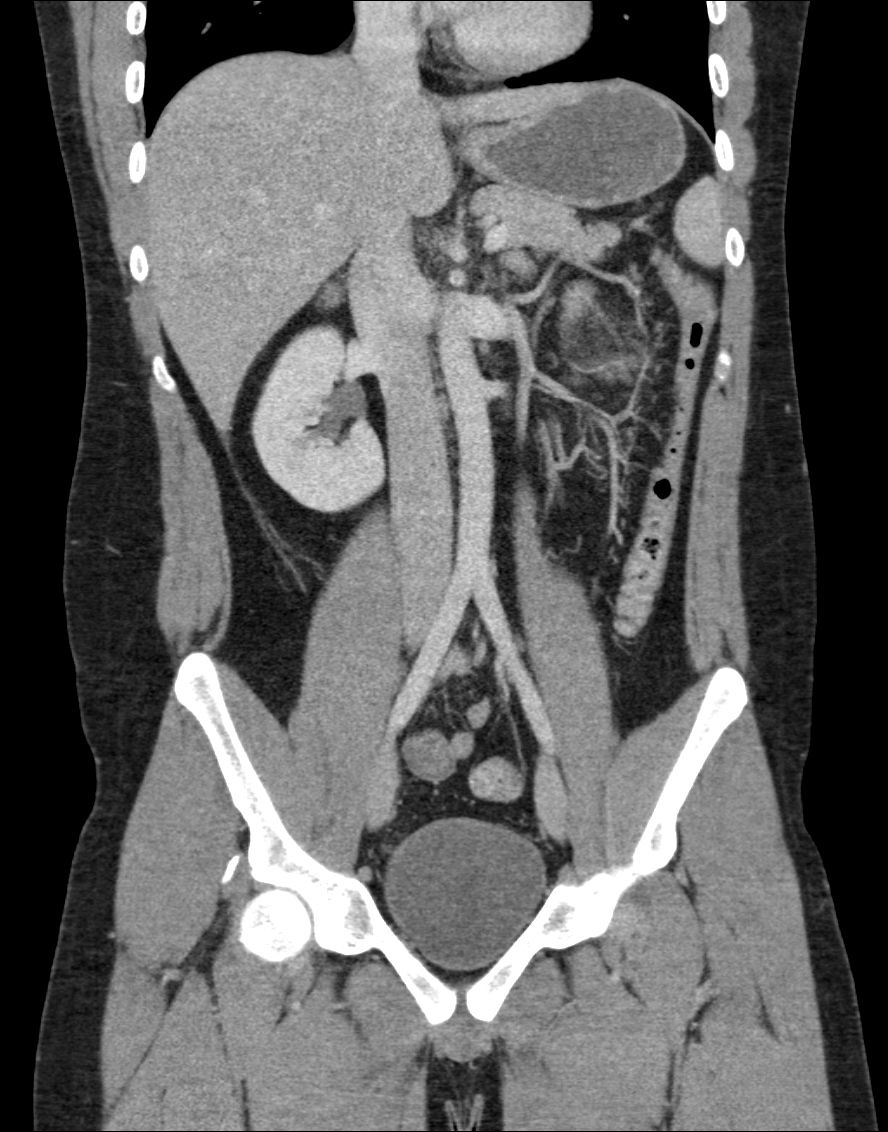
[im 69/124  soft-tissue]
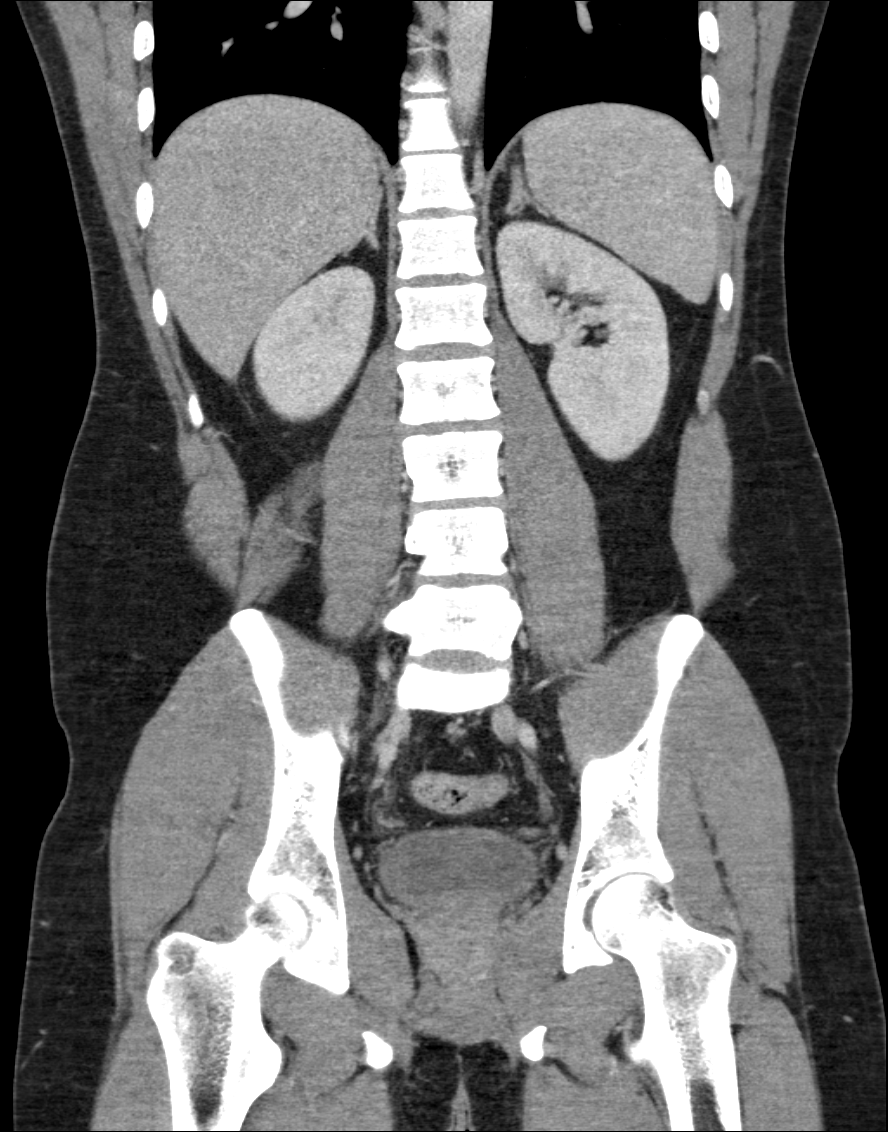

[10 of 46 positions shown; findings below may reference images not displayed]

FINDINGS: Lower chest: No acute abnormality.

Hepatobiliary: No focal liver abnormality is seen. No gallstones,
gallbladder wall thickening, or biliary dilatation.

Pancreas: Unremarkable. No pancreatic ductal dilatation or
surrounding inflammatory changes.

Spleen: Normal in size without focal abnormality.

Adrenals/Urinary Tract: Adrenal glands are unremarkable. Kidneys are
normal, without renal calculi, focal lesion, or hydronephrosis.
Bladder is unremarkable.

Stomach/Bowel: Stomach is within normal limits. No evidence of
small-bowel obstruction. The retrocecal appendix as distended to 16
mm and fluid-filled. Several appendicoliths are seen at the
appendiceal base. Periappendiceal mesenteric stranding is seen. No
evidence of rupture or abscess formation.

Vascular/Lymphatic: No significant vascular findings are present. No
enlarged abdominal or pelvic lymph nodes.

Reproductive: Prostate is unremarkable.

Other: No abdominal wall hernia or abnormality. No abdominopelvic
ascites.

Musculoskeletal: No acute or significant osseous findings.
IMPRESSION: Retrocecal appendix with acute appendicitis. There is no evidence of
rupture or abscess formation, however the appendix is significantly
distended to 18 mm, making impending rupture likely.

## 2019-02-03 ENCOUNTER — Other Ambulatory Visit: Payer: Self-pay

## 2019-02-03 DIAGNOSIS — Z20822 Contact with and (suspected) exposure to covid-19: Secondary | ICD-10-CM

## 2019-02-04 LAB — NOVEL CORONAVIRUS, NAA: SARS-CoV-2, NAA: DETECTED — AB
